# Patient Record
Sex: Female | Born: 1935 | Race: White | Hispanic: No
Health system: Southern US, Community
[De-identification: ages and names within clinical notes are randomized; demographics above are authoritative.]

---

## 2003-06-17 ENCOUNTER — Other Ambulatory Visit: Payer: Self-pay

## 2004-07-27 ENCOUNTER — Emergency Department: Payer: Self-pay | Admitting: General Practice

## 2005-01-12 ENCOUNTER — Ambulatory Visit: Payer: Self-pay | Admitting: Family Medicine

## 2005-05-22 ENCOUNTER — Ambulatory Visit: Payer: Self-pay | Admitting: Oncology

## 2005-06-19 ENCOUNTER — Ambulatory Visit: Payer: Self-pay | Admitting: Surgery

## 2005-06-20 ENCOUNTER — Ambulatory Visit: Payer: Self-pay | Admitting: Oncology

## 2005-07-13 ENCOUNTER — Ambulatory Visit: Payer: Self-pay | Admitting: Oncology

## 2005-08-13 ENCOUNTER — Ambulatory Visit: Payer: Self-pay | Admitting: Oncology

## 2005-09-12 ENCOUNTER — Ambulatory Visit: Payer: Self-pay | Admitting: Oncology

## 2005-09-29 ENCOUNTER — Ambulatory Visit: Payer: Self-pay | Admitting: Oncology

## 2005-10-13 ENCOUNTER — Ambulatory Visit: Payer: Self-pay | Admitting: Oncology

## 2005-11-12 ENCOUNTER — Ambulatory Visit: Payer: Self-pay | Admitting: Oncology

## 2005-12-29 ENCOUNTER — Ambulatory Visit: Payer: Self-pay | Admitting: Oncology

## 2006-01-11 ENCOUNTER — Ambulatory Visit: Payer: Self-pay | Admitting: Oncology

## 2006-01-13 ENCOUNTER — Ambulatory Visit: Payer: Self-pay | Admitting: Oncology

## 2006-02-12 ENCOUNTER — Ambulatory Visit: Payer: Self-pay | Admitting: Oncology

## 2006-03-15 ENCOUNTER — Ambulatory Visit: Payer: Self-pay | Admitting: Oncology

## 2006-04-14 ENCOUNTER — Ambulatory Visit: Payer: Self-pay | Admitting: Oncology

## 2006-05-15 ENCOUNTER — Ambulatory Visit: Payer: Self-pay | Admitting: Oncology

## 2006-06-15 ENCOUNTER — Ambulatory Visit: Payer: Self-pay | Admitting: Oncology

## 2006-07-19 ENCOUNTER — Ambulatory Visit: Payer: Self-pay | Admitting: Oncology

## 2006-07-20 ENCOUNTER — Ambulatory Visit: Payer: Self-pay | Admitting: Oncology

## 2006-07-25 ENCOUNTER — Ambulatory Visit: Payer: Self-pay | Admitting: Oncology

## 2006-08-14 ENCOUNTER — Ambulatory Visit: Payer: Self-pay | Admitting: Oncology

## 2006-10-23 ENCOUNTER — Ambulatory Visit: Payer: Self-pay | Admitting: Family Medicine

## 2006-10-24 ENCOUNTER — Ambulatory Visit: Payer: Self-pay | Admitting: Oncology

## 2006-11-10 ENCOUNTER — Other Ambulatory Visit: Payer: Self-pay

## 2006-11-10 ENCOUNTER — Inpatient Hospital Stay: Payer: Self-pay | Admitting: Unknown Physician Specialty

## 2006-11-13 ENCOUNTER — Ambulatory Visit: Payer: Self-pay | Admitting: Oncology

## 2007-01-14 ENCOUNTER — Ambulatory Visit: Payer: Self-pay | Admitting: Oncology

## 2007-02-07 ENCOUNTER — Ambulatory Visit: Payer: Self-pay | Admitting: Oncology

## 2007-02-13 ENCOUNTER — Ambulatory Visit: Payer: Self-pay | Admitting: Oncology

## 2007-02-27 ENCOUNTER — Ambulatory Visit: Payer: Self-pay | Admitting: Oncology

## 2007-03-07 ENCOUNTER — Ambulatory Visit: Payer: Self-pay | Admitting: Oncology

## 2007-03-11 ENCOUNTER — Ambulatory Visit: Payer: Self-pay | Admitting: General Practice

## 2007-03-16 ENCOUNTER — Ambulatory Visit: Payer: Self-pay | Admitting: Oncology

## 2007-03-25 ENCOUNTER — Inpatient Hospital Stay: Payer: Self-pay | Admitting: General Practice

## 2007-06-16 ENCOUNTER — Ambulatory Visit: Payer: Self-pay | Admitting: Oncology

## 2007-06-20 ENCOUNTER — Ambulatory Visit: Payer: Self-pay | Admitting: Oncology

## 2007-07-14 ENCOUNTER — Ambulatory Visit: Payer: Self-pay | Admitting: Oncology

## 2007-08-14 IMAGING — CT NM PET TUM IMG RESTAG (PS) SKULL BASE T - THIGH
1 series · 1 of 25 positions shown · non-contrast
Comparison: none

REASON FOR EXAM: lymphoma eval for residual disease
COMMENTS:

PROCEDURE:     PET - PET/CT RESTAGING LYMPHOMA  - September 29, 2005 [DATE]
RESULT:
HISTORY: Lymphoma.

[Series 3: ct wb fusion · axial · 4.0mm · 0.98mm/px · 1 of 488 slices shown]
[im 244/488]
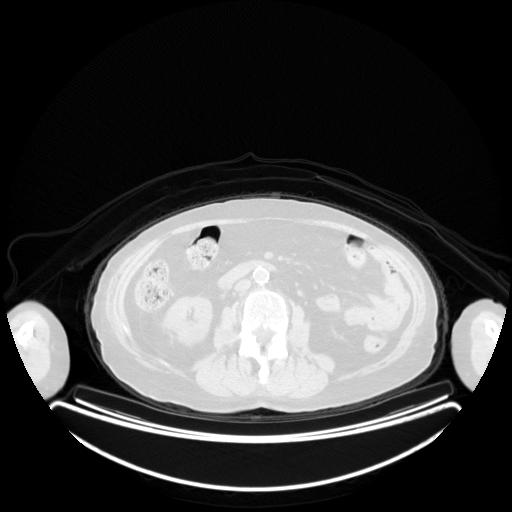

[1 of 25 positions shown; findings below may reference images not displayed]

COMPARISON STUDIES: No recent.

PROCEDURE AND FINDINGS: Following determination of fasting blood sugar of
103 mg/dl whole body PET scan was performed using 12 mCi of F-18 FDG.
Multifocal increased activity is noted throughout the cervical, thoracic and
lumbar spine. This is most likely secondary to the effects of chemotherapy.
No other abnormalities are identified to suggest metastatic disease or
presence of residual disease.
IMPRESSION: 1)Mild increased activity is noted throughout the cervical, thoracic and
lumbar spine marrow. This is most likely from marrow regeneration from prior
chemotherapy.

2)The exam is otherwise negative. No evidence of recurrent lymphoma is
noted.

## 2007-09-13 ENCOUNTER — Ambulatory Visit: Payer: Self-pay | Admitting: Oncology

## 2007-09-20 ENCOUNTER — Ambulatory Visit: Payer: Self-pay | Admitting: Oncology

## 2007-10-14 ENCOUNTER — Ambulatory Visit: Payer: Self-pay | Admitting: Oncology

## 2008-02-05 ENCOUNTER — Ambulatory Visit: Payer: Self-pay | Admitting: Oncology

## 2008-02-13 ENCOUNTER — Ambulatory Visit: Payer: Self-pay | Admitting: Family

## 2008-02-13 ENCOUNTER — Ambulatory Visit: Payer: Self-pay | Admitting: Oncology

## 2008-02-18 ENCOUNTER — Ambulatory Visit: Payer: Self-pay | Admitting: Unknown Physician Specialty

## 2008-02-26 ENCOUNTER — Ambulatory Visit: Payer: Self-pay | Admitting: Unknown Physician Specialty

## 2008-03-15 ENCOUNTER — Ambulatory Visit: Payer: Self-pay | Admitting: Oncology

## 2008-04-14 ENCOUNTER — Ambulatory Visit: Payer: Self-pay | Admitting: Oncology

## 2008-07-13 ENCOUNTER — Ambulatory Visit: Payer: Self-pay | Admitting: Oncology

## 2008-08-12 ENCOUNTER — Ambulatory Visit: Payer: Self-pay | Admitting: Oncology

## 2008-08-13 ENCOUNTER — Ambulatory Visit: Payer: Self-pay | Admitting: Oncology

## 2008-09-01 ENCOUNTER — Ambulatory Visit: Payer: Self-pay | Admitting: Family Medicine

## 2008-09-12 ENCOUNTER — Ambulatory Visit: Payer: Self-pay | Admitting: Oncology

## 2008-09-25 ENCOUNTER — Ambulatory Visit: Payer: Self-pay | Admitting: Oncology

## 2008-10-01 ENCOUNTER — Ambulatory Visit: Payer: Self-pay | Admitting: Family Medicine

## 2008-10-13 ENCOUNTER — Ambulatory Visit: Payer: Self-pay | Admitting: Oncology

## 2008-11-12 ENCOUNTER — Ambulatory Visit: Payer: Self-pay | Admitting: Oncology

## 2008-12-10 ENCOUNTER — Ambulatory Visit: Payer: Self-pay | Admitting: Oncology

## 2008-12-13 ENCOUNTER — Ambulatory Visit: Payer: Self-pay | Admitting: Oncology

## 2008-12-18 ENCOUNTER — Ambulatory Visit: Payer: Self-pay | Admitting: General Practice

## 2009-04-28 ENCOUNTER — Ambulatory Visit: Payer: Self-pay | Admitting: Anesthesiology

## 2009-05-15 ENCOUNTER — Ambulatory Visit: Payer: Self-pay | Admitting: Oncology

## 2009-06-10 ENCOUNTER — Ambulatory Visit: Payer: Self-pay | Admitting: Oncology

## 2009-06-15 ENCOUNTER — Ambulatory Visit: Payer: Self-pay | Admitting: Anesthesiology

## 2009-06-15 ENCOUNTER — Ambulatory Visit: Payer: Self-pay | Admitting: Oncology

## 2009-06-16 ENCOUNTER — Ambulatory Visit: Payer: Self-pay | Admitting: Oncology

## 2009-07-22 ENCOUNTER — Ambulatory Visit: Payer: Self-pay | Admitting: Anesthesiology

## 2009-08-13 ENCOUNTER — Ambulatory Visit: Payer: Self-pay | Admitting: Anesthesiology

## 2009-09-12 ENCOUNTER — Ambulatory Visit: Payer: Self-pay | Admitting: Oncology

## 2009-09-23 ENCOUNTER — Ambulatory Visit: Payer: Self-pay | Admitting: Oncology

## 2009-09-28 ENCOUNTER — Ambulatory Visit: Payer: Self-pay | Admitting: Oncology

## 2009-10-08 ENCOUNTER — Ambulatory Visit: Payer: Self-pay | Admitting: Anesthesiology

## 2009-10-13 ENCOUNTER — Ambulatory Visit: Payer: Self-pay | Admitting: Oncology

## 2009-10-26 ENCOUNTER — Inpatient Hospital Stay: Payer: Self-pay | Admitting: Specialist

## 2009-11-12 ENCOUNTER — Ambulatory Visit: Payer: Self-pay | Admitting: Oncology

## 2009-12-06 ENCOUNTER — Ambulatory Visit: Payer: Self-pay | Admitting: Oncology

## 2009-12-09 ENCOUNTER — Ambulatory Visit: Payer: Self-pay | Admitting: Anesthesiology

## 2010-01-26 ENCOUNTER — Ambulatory Visit: Payer: Self-pay | Admitting: Anesthesiology

## 2010-03-04 ENCOUNTER — Emergency Department: Payer: Self-pay | Admitting: Emergency Medicine

## 2010-03-08 ENCOUNTER — Ambulatory Visit: Payer: Self-pay | Admitting: Oncology

## 2010-03-15 ENCOUNTER — Ambulatory Visit: Payer: Self-pay | Admitting: Oncology

## 2010-04-22 ENCOUNTER — Ambulatory Visit: Payer: Self-pay | Admitting: Oncology

## 2010-04-26 ENCOUNTER — Ambulatory Visit: Payer: Self-pay | Admitting: Anesthesiology

## 2010-05-15 ENCOUNTER — Ambulatory Visit: Payer: Self-pay | Admitting: Oncology

## 2010-05-27 ENCOUNTER — Ambulatory Visit: Payer: Self-pay | Admitting: Anesthesiology

## 2010-08-17 ENCOUNTER — Ambulatory Visit: Payer: Self-pay | Admitting: Unknown Physician Specialty

## 2010-08-30 ENCOUNTER — Ambulatory Visit: Payer: Self-pay | Admitting: Unknown Physician Specialty

## 2010-09-12 ENCOUNTER — Ambulatory Visit: Payer: Self-pay | Admitting: Anesthesiology

## 2010-09-20 ENCOUNTER — Ambulatory Visit: Payer: Self-pay | Admitting: Oncology

## 2010-10-14 ENCOUNTER — Ambulatory Visit: Payer: Self-pay | Admitting: Oncology

## 2011-01-27 ENCOUNTER — Ambulatory Visit: Payer: Self-pay | Admitting: Oncology

## 2011-01-28 ENCOUNTER — Emergency Department: Payer: Self-pay | Admitting: *Deleted

## 2011-01-29 ENCOUNTER — Ambulatory Visit: Payer: Self-pay | Admitting: Oncology

## 2011-02-07 ENCOUNTER — Inpatient Hospital Stay: Payer: Self-pay | Admitting: Internal Medicine

## 2011-02-13 ENCOUNTER — Inpatient Hospital Stay: Payer: Self-pay | Admitting: Internal Medicine

## 2011-02-13 ENCOUNTER — Ambulatory Visit: Payer: Self-pay | Admitting: Oncology

## 2011-07-26 ENCOUNTER — Ambulatory Visit: Payer: Self-pay | Admitting: Oncology

## 2011-07-26 LAB — COMPREHENSIVE METABOLIC PANEL
Albumin: 3.7 g/dL (ref 3.4–5.0)
Anion Gap: 10 (ref 7–16)
BUN: 18 mg/dL (ref 7–18)
Chloride: 103 mmol/L (ref 98–107)
Co2: 27 mmol/L (ref 21–32)
Creatinine: 0.95 mg/dL (ref 0.60–1.30)
Glucose: 99 mg/dL (ref 65–99)
Osmolality: 281 (ref 275–301)
Sodium: 140 mmol/L (ref 136–145)
Total Protein: 7 g/dL (ref 6.4–8.2)

## 2011-07-26 LAB — CBC CANCER CENTER
Basophil %: 0.5 %
Eosinophil #: 0.1 x10 3/mm (ref 0.0–0.7)
HGB: 12.2 g/dL (ref 12.0–16.0)
Lymphocyte %: 20.3 %
MCH: 31.5 pg (ref 26.0–34.0)
MCHC: 34 g/dL (ref 32.0–36.0)
MCV: 93 fL (ref 80–100)
Monocyte #: 0.7 x10 3/mm (ref 0.0–0.7)
Neutrophil %: 67.3 %
RDW: 13.8 % (ref 11.5–14.5)

## 2011-08-14 ENCOUNTER — Ambulatory Visit: Payer: Self-pay | Admitting: Oncology

## 2011-10-12 ENCOUNTER — Emergency Department: Payer: Self-pay | Admitting: Emergency Medicine

## 2011-11-13 ENCOUNTER — Ambulatory Visit: Payer: Self-pay | Admitting: Internal Medicine

## 2011-12-02 LAB — CBC
Platelet: 174 10*3/uL (ref 150–440)
RDW: 16.6 % — ABNORMAL HIGH (ref 11.5–14.5)
WBC: 6.6 10*3/uL (ref 3.6–11.0)

## 2011-12-02 LAB — BASIC METABOLIC PANEL
Anion Gap: 16 (ref 7–16)
BUN: 52 mg/dL — ABNORMAL HIGH (ref 7–18)
Chloride: 95 mmol/L — ABNORMAL LOW (ref 98–107)
Co2: 21 mmol/L (ref 21–32)
Creatinine: 2.12 mg/dL — ABNORMAL HIGH (ref 0.60–1.30)
EGFR (Non-African Amer.): 21 — ABNORMAL LOW
Sodium: 132 mmol/L — ABNORMAL LOW (ref 136–145)

## 2011-12-03 ENCOUNTER — Inpatient Hospital Stay: Payer: Self-pay | Admitting: Internal Medicine

## 2011-12-03 LAB — URINALYSIS, COMPLETE
Leukocyte Esterase: NEGATIVE
Nitrite: NEGATIVE
Protein: 100
RBC,UR: 4 /HPF (ref 0–5)
Specific Gravity: 1.023 (ref 1.003–1.030)
Squamous Epithelial: 3
WBC UR: 1 /HPF (ref 0–5)

## 2011-12-03 LAB — BASIC METABOLIC PANEL
Anion Gap: 22 — ABNORMAL HIGH (ref 7–16)
BUN: 52 mg/dL — ABNORMAL HIGH (ref 7–18)
BUN: 55 mg/dL — ABNORMAL HIGH (ref 7–18)
Calcium, Total: 9.5 mg/dL (ref 8.5–10.1)
Co2: 13 mmol/L — ABNORMAL LOW (ref 21–32)
Co2: 15 mmol/L — ABNORMAL LOW (ref 21–32)
Creatinine: 2.21 mg/dL — ABNORMAL HIGH (ref 0.60–1.30)
EGFR (African American): 21 — ABNORMAL LOW
EGFR (Non-African Amer.): 20 — ABNORMAL LOW
EGFR (Non-African Amer.): 18 — ABNORMAL LOW
Glucose: 125 mg/dL — ABNORMAL HIGH (ref 65–99)
Glucose: 115 mg/dL — ABNORMAL HIGH (ref 65–99)
Osmolality: 284 (ref 275–301)
Osmolality: 284 (ref 275–301)

## 2011-12-03 LAB — MAGNESIUM: Magnesium: 2.3 mg/dL

## 2011-12-03 LAB — CK TOTAL AND CKMB (NOT AT ARMC)
CK, Total: 133 U/L (ref 21–215)
CK, Total: 120 U/L (ref 21–215)
CK, Total: 112 U/L (ref 21–215)

## 2011-12-03 LAB — HEPATIC FUNCTION PANEL A (ARMC)
Albumin: 3.4 g/dL (ref 3.4–5.0)
Albumin: 3.1 g/dL — ABNORMAL LOW (ref 3.4–5.0)
Alkaline Phosphatase: 197 U/L — ABNORMAL HIGH (ref 50–136)
Alkaline Phosphatase: 195 U/L — ABNORMAL HIGH (ref 50–136)
Bilirubin, Direct: 1.9 mg/dL — ABNORMAL HIGH (ref 0.00–0.20)
Bilirubin,Total: 2.6 mg/dL — ABNORMAL HIGH (ref 0.2–1.0)
Bilirubin,Total: 2.9 mg/dL — ABNORMAL HIGH (ref 0.2–1.0)
SGOT(AST): 225 U/L — ABNORMAL HIGH (ref 15–37)
SGOT(AST): 277 U/L — ABNORMAL HIGH (ref 15–37)
SGPT (ALT): 260 U/L — ABNORMAL HIGH

## 2011-12-03 LAB — TROPONIN I: Troponin-I: 0.05 ng/mL

## 2011-12-03 LAB — URIC ACID: Uric Acid: 13.5 mg/dL — ABNORMAL HIGH (ref 2.6–6.0)

## 2011-12-03 LAB — SODIUM, URINE, RANDOM: Sodium, Urine Random: 10 mmol/L (ref 20–110)

## 2011-12-03 LAB — PRO B NATRIURETIC PEPTIDE: B-Type Natriuretic Peptide: 31957 pg/mL — ABNORMAL HIGH (ref 0–450)

## 2011-12-04 LAB — BASIC METABOLIC PANEL
Calcium, Total: 9 mg/dL (ref 8.5–10.1)
Chloride: 98 mmol/L (ref 98–107)
Co2: 14 mmol/L — ABNORMAL LOW (ref 21–32)
Creatinine: 2.66 mg/dL — ABNORMAL HIGH (ref 0.60–1.30)
Glucose: 54 mg/dL — ABNORMAL LOW (ref 65–99)
Osmolality: 283 (ref 275–301)
Potassium: 4.5 mmol/L (ref 3.5–5.1)
Sodium: 134 mmol/L — ABNORMAL LOW (ref 136–145)

## 2011-12-04 LAB — HEPATIC FUNCTION PANEL A (ARMC)
Albumin: 3.3 g/dL — ABNORMAL LOW (ref 3.4–5.0)
Alkaline Phosphatase: 192 U/L — ABNORMAL HIGH (ref 50–136)
Bilirubin, Direct: 2.2 mg/dL — ABNORMAL HIGH (ref 0.00–0.20)
Bilirubin,Total: 3.4 mg/dL — ABNORMAL HIGH (ref 0.2–1.0)
Total Protein: 6 g/dL — ABNORMAL LOW (ref 6.4–8.2)

## 2011-12-05 LAB — CBC WITH DIFFERENTIAL/PLATELET
Basophil #: 0 10*3/uL (ref 0.0–0.1)
Eosinophil #: 0 10*3/uL (ref 0.0–0.7)
Eosinophil %: 0 %
HCT: 42.8 % (ref 35.0–47.0)
HGB: 13.2 g/dL (ref 12.0–16.0)
Lymphocyte #: 0.5 10*3/uL — ABNORMAL LOW (ref 1.0–3.6)
Lymphocyte %: 3.7 %
MCH: 26.5 pg (ref 26.0–34.0)
MCHC: 30.8 g/dL — ABNORMAL LOW (ref 32.0–36.0)
MCV: 86 fL (ref 80–100)
Monocyte #: 0.9 x10 3/mm (ref 0.2–0.9)
Neutrophil #: 11.7 10*3/uL — ABNORMAL HIGH (ref 1.4–6.5)
Platelet: 113 10*3/uL — ABNORMAL LOW (ref 150–440)
RBC: 4.99 10*6/uL (ref 3.80–5.20)
RDW: 16.9 % — ABNORMAL HIGH (ref 11.5–14.5)
WBC: 13.1 10*3/uL — ABNORMAL HIGH (ref 3.6–11.0)

## 2011-12-05 LAB — HEPATIC FUNCTION PANEL A (ARMC)
Albumin: 3.5 g/dL (ref 3.4–5.0)
Bilirubin, Direct: 2.5 mg/dL — ABNORMAL HIGH (ref 0.00–0.20)
Bilirubin,Total: 3.5 mg/dL — ABNORMAL HIGH (ref 0.2–1.0)
SGOT(AST): 682 U/L — ABNORMAL HIGH (ref 15–37)
Total Protein: 6.2 g/dL — ABNORMAL LOW (ref 6.4–8.2)

## 2011-12-05 LAB — BASIC METABOLIC PANEL
BUN: 70 mg/dL — ABNORMAL HIGH (ref 7–18)
Calcium, Total: 8.8 mg/dL (ref 8.5–10.1)
Co2: 19 mmol/L — ABNORMAL LOW (ref 21–32)
EGFR (African American): 16 — ABNORMAL LOW
EGFR (Non-African Amer.): 14 — ABNORMAL LOW
Glucose: 118 mg/dL — ABNORMAL HIGH (ref 65–99)
Sodium: 138 mmol/L (ref 136–145)

## 2011-12-05 LAB — URINE CULTURE

## 2011-12-09 LAB — CULTURE, BLOOD (SINGLE)

## 2011-12-14 ENCOUNTER — Ambulatory Visit: Payer: Self-pay | Admitting: Internal Medicine

## 2011-12-14 DEATH — deceased

## 2012-02-01 ENCOUNTER — Ambulatory Visit: Payer: Self-pay | Admitting: Oncology

## 2012-12-12 IMAGING — CR DG CHEST 2V
1 series · 3 of 3 positions shown · non-contrast
Comparison: none

REASON FOR EXAM: right sided pain
COMMENTS:

PROCEDURE:     DXR - DXR CHEST PA (OR AP) AND LATERAL  - January 28, 2011  [DATE]
RESULT:     Lungs are clear. The patient has had bilateral shoulder
replacements. Calcified pulmonary nodule noted in the left lung base
consistent with granuloma. No evidence of displaced rib fracture.

[Series 1: view not recorded · 0.17mm/px · 3 of 3 slices shown]
[im 1/3]
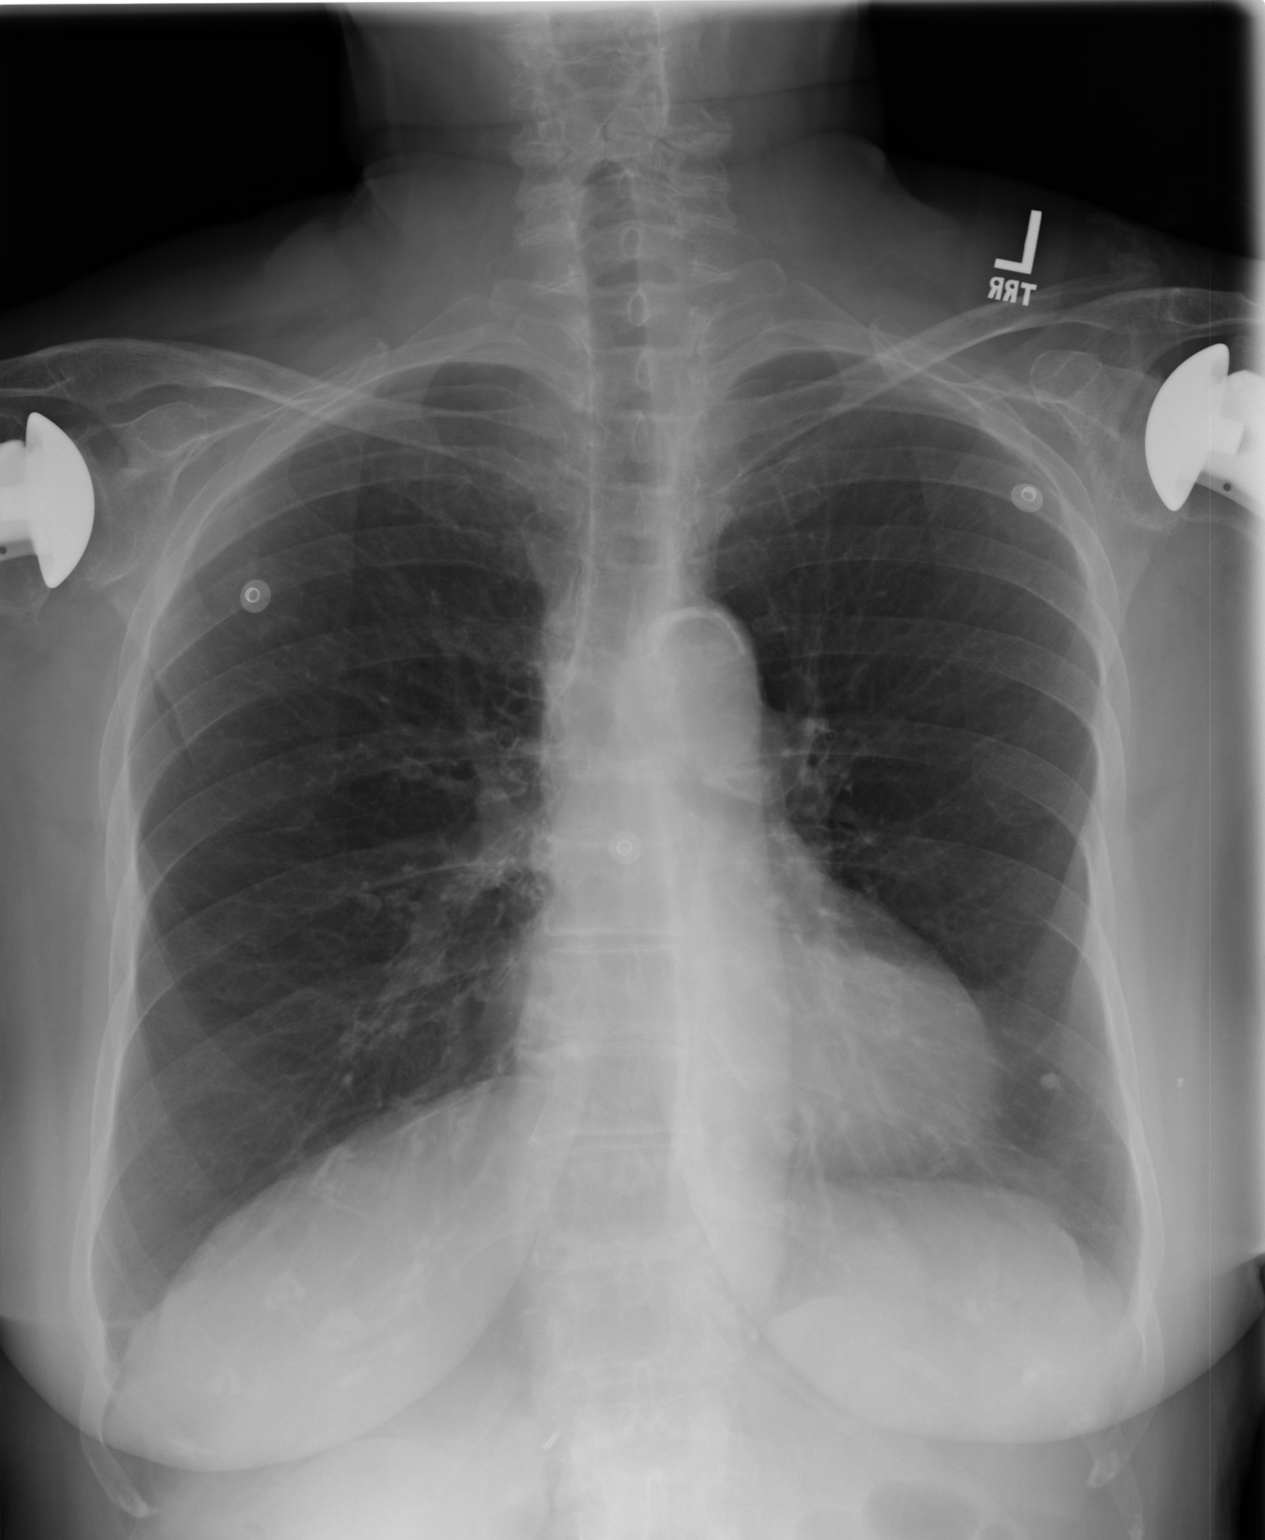
[im 2/3]
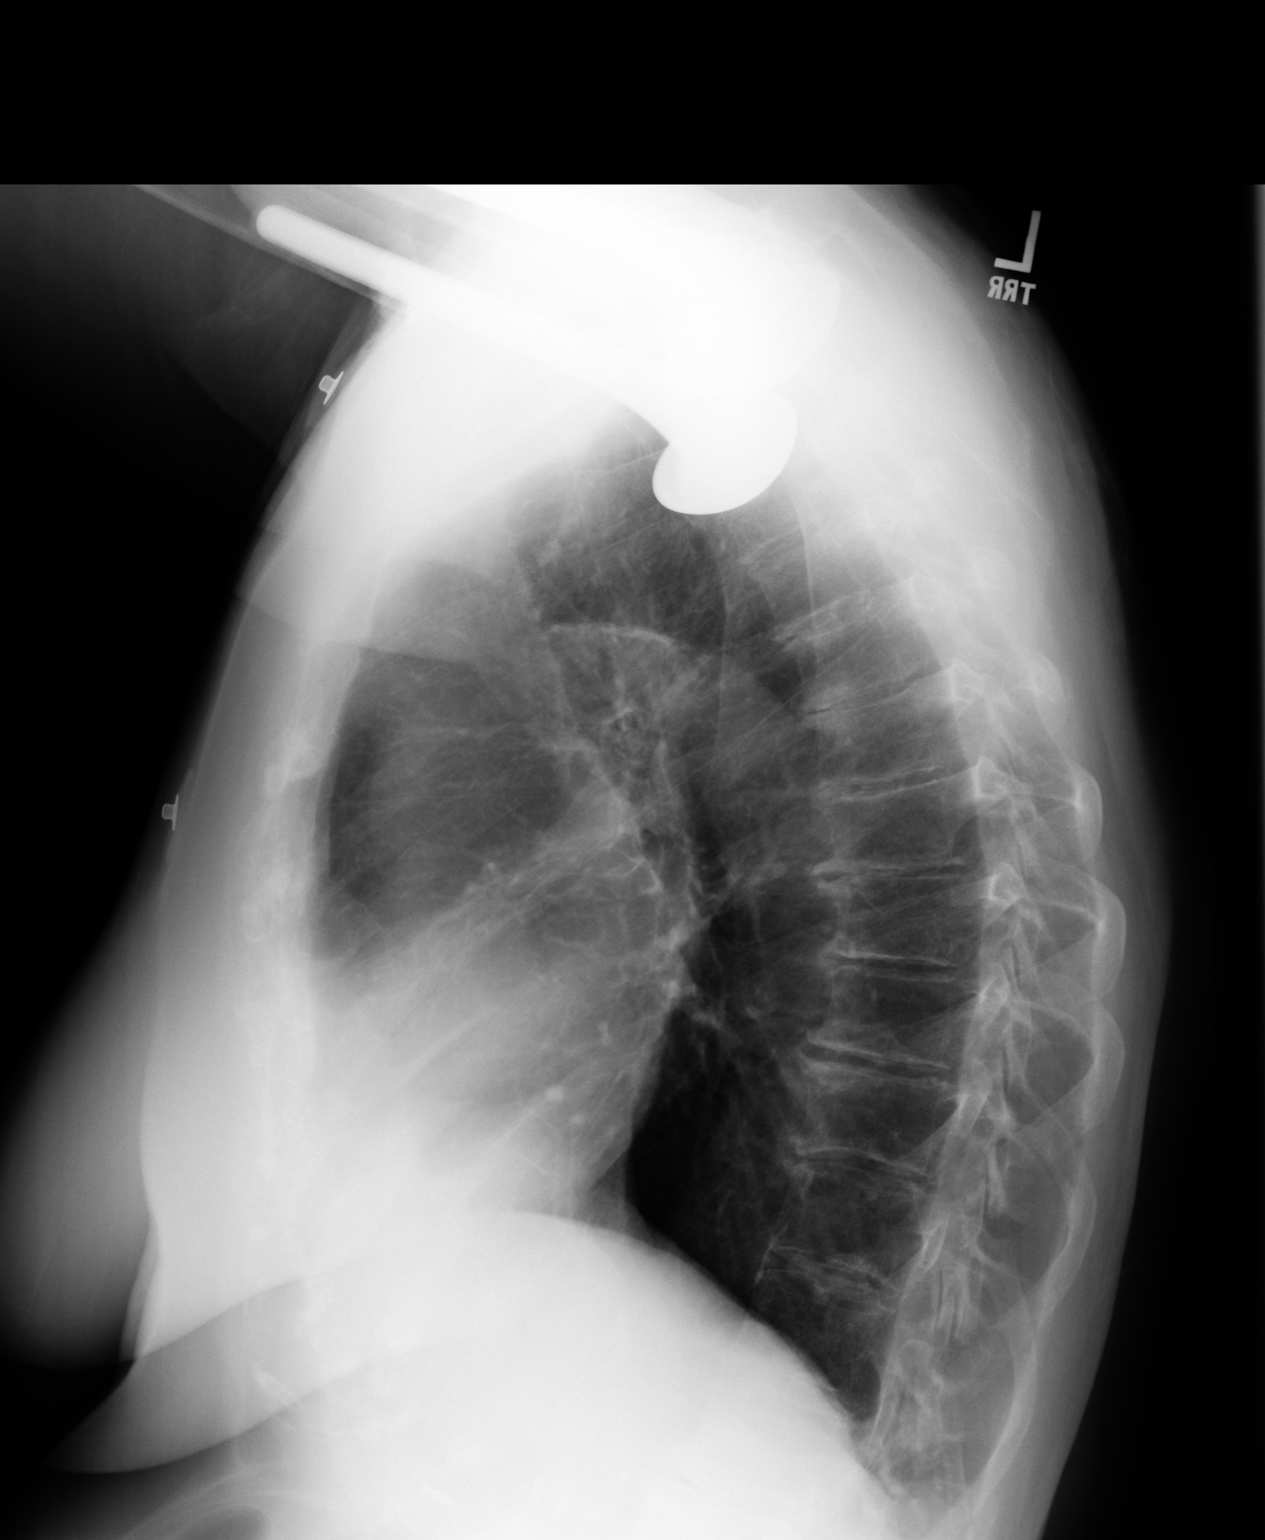
[im 3/3]
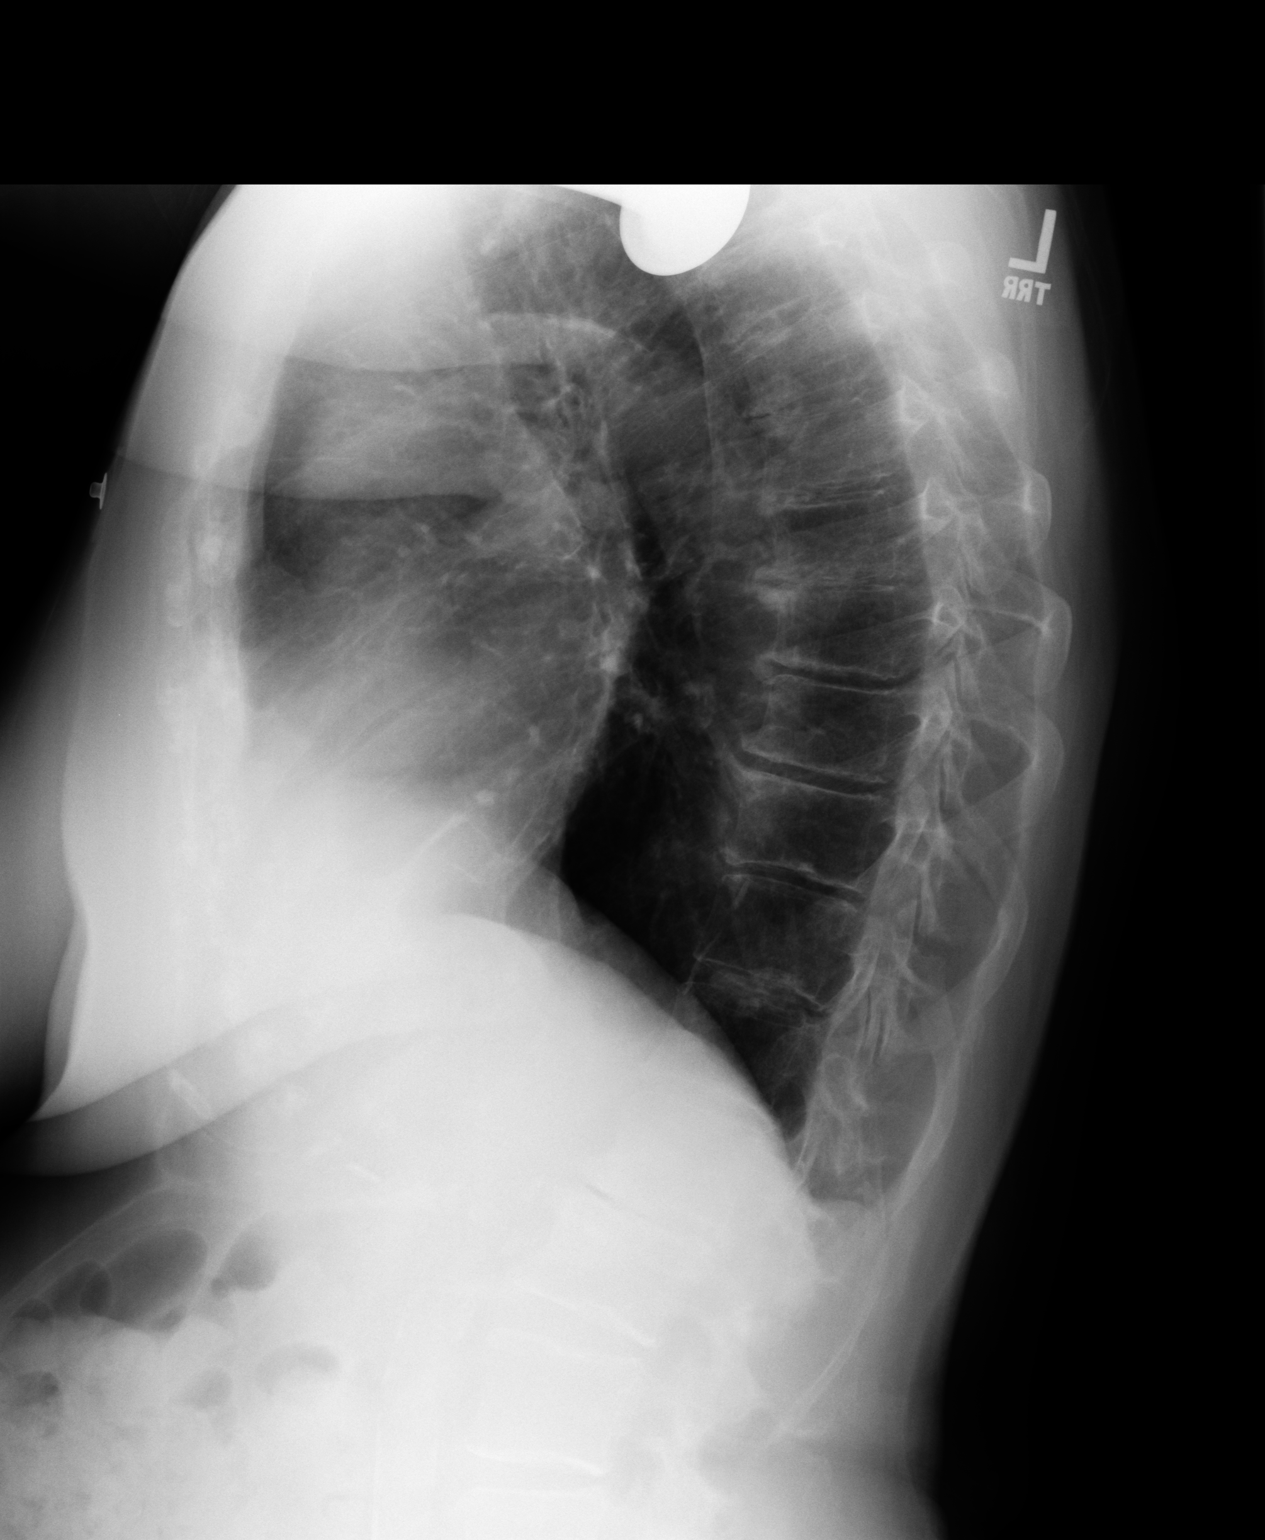

[3 of 3 positions shown; findings below may reference images not displayed]

IMPRESSION: No acute abnormality. The patient has had bilateral
shoulder replacements.

## 2014-09-01 NOTE — Consult Note (Signed)
PATIENT NAME:  Karen Stafford, BARRETTO MR#:  [id number] DATE OF BIRTH:  Aug 08, 1924  DATE OF CONSULTATION:  12/03/2011  REFERRING PHYSICIAN:  Dr. Katharina Caper  CONSULTING PHYSICIAN:  Christena Deem, MD  REASON FOR CONSULTATION: Elevated LFTs.   HISTORY OF PRESENT ILLNESS: Ms. Karen Stafford is an 81 year old Caucasian female who was brought to the hospital earlier today from her nursing home at [facility]. She has been noted to be having increased problems with confusion. She was brought to the Emergency Room for further evaluation. There are no relatives in the room and no assistance. Patient herself is confused and minimally able to contribute to history. Much of the history is obtained from the admission history and physical. Patient has a history of increasing dementia, however, over the period of the past several days she has had increased confusion worsening. It has also been noted that she has been having some increased shortness of breath as well as some lower extremity edema. She apparently also has been on some ciprofloxacin for possible urinary tract infection. In the Emergency Room department when interviewed by the admitting physician she was confused, minimally verbal. She was found to be in acute renal failure with abnormal liver associated enzymes. On evaluation by this physician patient is in no acute distress.   PAST MEDICAL HISTORY:  1. Hypertension. 2. Non-Hodgkin's lymphoma in remission since 2007.  3. Osteoarthritis.  4. Asthma. 5. Previous acute renal failure. 6. History of shingles. 7. Altered mental status since episode possibly of hyponatremia in 2012 raising possible question of central pontine myelolysis versus encephalopathy of hyponatremia.  8. Dementia.   PAST SURGICAL HISTORY:  1. Intraocular lenses 1996, 1997. 2. Cholecystectomy 1999. 3. Right knee arthroscopy 2011. 4. Right shoulder replacement 2003.  5. Spur removal rotator cuff surgery 2004. 6. Left shoulder  replacement 2005. 7. Open reduction internal fixation, right distal fracture 2009. 8. Right knee arthroplasty 2008.   CURRENT MEDICATIONS:  1. Allegra 1 tablet daily. 2. Alprazolam 0.25 mg 1 t.i.d.  3. Aspirin 81 mg daily. 4. Celexa 30 mg a day. 5. Cipro 250 mg 1 orally every 12 hours. 6. Citalopram 10 mg once a day. 7. Colace 100 mg 1 twice a day. 8. Hydrochlorothiazide 25 mg a day. 9. Lasix 40 mg daily. 10. MiraLax.  11. Senna. 12. Singulair 10 mg a day.  13. Systane balance ophthalmic solution p.r.n.  14. Tylenol caplets p.r.n.   ALLERGIES: She is allergic to Augmentin ES 600, sulfa, tetracycline, Vicodin, adhesives and lactose.   REVIEW OF SYSTEMS: Not possible to obtain.   PHYSICAL EXAMINATION:  VITAL SIGNS: Temperature 97.6, pulse 106, respirations 18, blood pressure 134/76, pulse oximetry 96%.   GENERAL: She is an 81 year old Caucasian female no acute distress, confused, not oriented to person, time or place.   HEENT: Normocephalic, atraumatic. Eyes: Anicteric. Nose: Septum midline. Oropharynx: No lesions.   NECK: No JVD. No lymphadenopathy or thyromegaly.   HEART: Regular rate and rhythm.   LUNGS: Clear to auscultation.   ABDOMEN: Slightly protuberant, soft, nontender. Bowel sounds positive, normoactive. There is possibly some amount of ascites with a mild fluid wave. There is no rebound. There is no apparent organomegaly.   EXTREMITIES: No clubbing or cyanosis. There is a 1+ to 2+ lower extremity edema.   LABORATORY, DIAGNOSTIC, AND RADIOLOGICAL DATA: On admission to the hospital she had a glucose 142, BNP elevated at 31,957, BUN 52, creatinine 2.12, sodium 132, potassium 3.5, chloride 95. Her hepatic profile showed a total protein of  6.4, albumin 3.4, total bilirubin 2.6, direct bilirubin 1.0, alkaline phosphatase 197, AST 225, ALT 260. She has had three sets of cardiac enzymes which have been normal with the exception of CPK-MB on the last set being elevated at 4.0  on a scale of 3.6. Hemogram on admission showed white count 6.6, hemoglobin/hematocrit 13.5/42.2, platelet count 174. Urine sodium 10. Urine random osmolality 542. Repeat labs today showed BUN 55, creatinine 2.35, sodium 134, potassium 4.8, chloride 98, bicarbonate 15, uric acid 13.5, magnesium 2.3. LFTs with some amount of change with total bilirubin being 2.9, direct being 1.9, alkaline phosphatase 195, AST 277, ALT 290. She has had a portable chest film suggestive of pleural effusions, basilar atelectasis versus infiltrate. She had an abdominal ultrasound today one for evaluation of kidneys, these showing right renal cyst. No obstruction or stones, left ureteral jet was not visualized, but no obstructive changes were noted. The abdominal ultrasound otherwise showed a biphasic portal venous flow with ascites present. Study was limited due to overlying bowel gas.   ASSESSMENT: Abnormal liver associated enzymes in the setting of tricuspid insufficiency/regurgitation with finding of biphasic portal venous circulation. Ascites was present. Differential diagnosis would include abnormal liver associated enzymes related to Cipro use as she did start a course of Cipro several days ago. There is some question as to whether she may have some mild asterixis. Impression: Cardiac ascites with tricuspid regurgitation.   RECOMMENDATION: Continue to treat congestive heart failure. Daily liver function tests. Will check ammonia this evening. Will follow with you. Thank you for this consultation.  ____________________________ Christena DeemMartin U. Skulskie, MD mus:cms D: 12/03/2011 20:42:29 ET T: 12/04/2011 07:59:46 ET  JOB#: [id number] cc: Christena DeemMartin U. Skulskie, MD, <Dictator>  Christena DeemMARTIN U SKULSKIE MD ELECTRONICALLY SIGNED 01/03/2012 21:09

## 2014-09-01 NOTE — Consult Note (Signed)
Chief Complaint:   Subjective/Chief Complaint poor orientation, lessening responses to examination. denies abdominal pain or nausea.   VITAL SIGNS/ANCILLARY NOTES: **Vital Signs.:   22-Jul-13 15:07   Vital Signs Type Routine   Temperature Temperature (F) 97.4   Celsius 36.3   Temperature Source Oral   Pulse Pulse 98   Respirations Respirations 18   Systolic BP Systolic BP 811   Diastolic BP (mmHg) Diastolic BP (mmHg) 75   Mean BP 83   Pulse Ox % Pulse Ox % 97   Pulse Ox Activity Level  At rest   Oxygen Delivery Room Air/ 21 %   Brief Assessment:   Cardiac Irregular    Respiratory clear BS    Gastrointestinal details normal Soft  Nontender  Nondistended  No masses palpable  Bowel sounds normal   Lab Results: Hepatic:  22-Jul-13 03:35    Bilirubin, Total  3.4   Bilirubin, Direct  2.2 (Result(s) reported on 04 Dec 2011 at 04:21AM.)   Alkaline Phosphatase  192   SGPT (ALT)  413 (12-78 NOTE: NEW REFERENCE RANGE 04/07/2011)   SGOT (AST)  470   Total Protein, Serum  6.0   Albumin, Serum  3.3  Routine Chem:  22-Jul-13 03:35    Ammonia, Plasma  53 (Result(s) reported on 04 Dec 2011 at 04:33AM.)   Glucose, Serum  54   BUN  60   Creatinine (comp)  2.66   Sodium, Serum  134   Potassium, Serum 4.5   Chloride, Serum 98   CO2, Serum  14   Calcium (Total), Serum 9.0   Anion Gap  22   Osmolality (calc) 283   eGFR (African American)  18   eGFR (Non-African American)  16 (eGFR values <64m/min/1.73 m2 may be an indication of chronic kidney disease (CKD). Calculated eGFR is useful in patients with stable renal function. The eGFR calculation will not be reliable in acutely ill patients when serum creatinine is changing rapidly. It is not useful in  patients on dialysis. The eGFR calculation may not be applicable to patients at the low and high extremes of body sizes, pregnant women, and vegetarians.)   Assessment/Plan:  Assessment/Plan:   Assessment 1) abnormal liver  tests-biphasic portal flow noted.  TR/hepatic congestion in the setting of chf. CT today not indicative of other etiology.  some increasing abnormality today, non-specific pattern.  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 2) congestive hepatopathy will improve with improvement/optimization of cardiac status/renal status.   Electronic Signatures: SLoistine Simas(MD)  (Signed 22-Jul-13 21:13)  Authored: Chief Complaint, VITAL SIGNS/ANCILLARY NOTES, Brief Assessment, Lab Results, Assessment/Plan   Last Updated: 22-Jul-13 21:13 by SLoistine Simas(MD)

## 2014-09-01 NOTE — H&P (Signed)
PATIENT NAME:  Karen Stafford, Karen Stafford MR#:  [id number] DATE OF BIRTH:  03/17/1925  DATE OF ADMISSION:  12/03/2011  PRIMARY CARE PHYSICIAN: Rhona Leavens. Burnett Sheng, MD   CHIEF COMPLAINT: Altered mental status, dyspnea.   HISTORY OF PRESENT ILLNESS: The patient is an 81 year old Caucasian female with chronic medical conditions notable for dementia, a nursing home resident at [facility], presents with progressive confusion per Nursing Home Facility.  History is obtained from Emergency Department  records as well as a friend, Ilda Foil, who is at bedside. History is very limited. Per Ms. Katrinka Blazing, the patient has had functional and mental decline over the last few months, increasingly confused.  However, it seems that this confusion worsened over the preceding few days.  Emergency Department  records note that the patient has been having some shortness of breath, and Ms. Katrinka Blazing admits that the patient has had increased lower extremity edema for some time.  It is unclear the duration of this increased lower extremity edema.  Review of nursing home records notes that the patient was started on Lasix 40 mg daily as needed for edema on 11/02/2011, but I do not see it documented that this medication was administered.  Of note, a review of nursing home medication reconciliation sheets also indicate that the patient was started on Ciprofloxacin 50 mg b.i.d.  for a 7-day course starting two days prior to presentation on 12/01/2011.  It is unclear if she was having symptoms or if they had performed an outpatient study indicating this patient had a urinary tract infection warranting this treatment. In the Emergency Department , the patient is confused, minimally verbal, and has been found to be in acute renal failure as well as concern for possible congestive heart failure, so we were called for admission. Her friend relates that due to her cognitive decline, she has been removed from the assisted living area of [facility]  to the nursing home facility.  The patient was last hospitalized in October of 2012 for altered mental status presumed to be secondary to electrolyte imbalances as she was significantly hyponatremic at that time.   PAST MEDICAL HISTORY: Notable for: 1. Hypertension.  2. Non-Hodgkin lymphoma, in remission since 2007. 3. Osteoarthritis.  4. Asthma.  5. Prior history of acute renal failure.  6. Shingles.  7. Altered mental status presumed secondary to hyponatremia in 2012.  8. Dementia.  PAST SURGICAL HISTORY: Notable for: 1. Implants in eyes in 1996 and 1997. 2. Cholecystectomy in 1999.  3. Right knee arthroscopy in 2011.  4. Right shoulder replacement in 2003. 5. Spur removal from rotator cuff in 2004.  6. Left shoulder replacement in 2005.  7. Open reduction internal fixation of right distal fracture in 2009. 8. Right knee arthroplasty in 2008.  MEDICATIONS:  1. Aspirin 81 mg chewable, 1 tablet daily. 2. Singulair 10 mg, 1 tablet by mouth daily.  3. Hydrochlorothiazide 25 mg daily. 4. MiraLAX large size, 1 bottle cap or packet daily. 5. Senokot 1 tablet twice a day.  6. Colace 100 mg twice a day. 7. Tylenol 500 mg, 1 to 2 tablets by mouth every 6 hours as needed for back pain.  8. Systane eye drops, 1 or 2 drops into right eye as needed for eye irritation. 9. Celexa 10 mg daily. 10. Lasix 40 mg daily p.r.n.  edema.  11. Allegra 180 mg daily, last dose was on the 20th of July.  12. Ciprofloxacin 50 mg twice a day for 7 days. Last  dose was on the 20th, and this order started July 19th at 8 p.m.   ALLERGIES: Morphine, lactose, sulfa, Vicodin, Augmentin, tetracycline and adhesive.  SOCIAL HISTORY: Per records, no tobacco, alcohol or illicit drug use. [facility] nursing home area.   FAMILY HISTORY: Per records, family history is notable for hypertension.   REVIEW OF SYSTEMS: Unable to obtain due to the patient's decline in mental status; however, per report,  notable for dyspnea and increased lower extremity edema.   PHYSICAL EXAMINATION:  VITAL SIGNS: Temperature 97.6, blood pressure 127/76, heart rate 110, respirations 20, saturating 93% on room air.  Weight is 77.1 kg.   GENERAL: The patient is awake, somnolent, but arouses to loud voice. She is oriented to person, place and time, no apparent distress.   HEENT: Head is atraumatic.  Pupils are equal, round and reactive to light.  Anicteric sclerae. Dry mucous membranes. Oropharynx is clear.   NECK: Supple, no jugular venous distention or carotid bruits appreciated.   CARDIOVASCULAR: Tachycardic, no murmur appreciated.   LUNGS: Mild crackles left lung base, no wheezing.   BACK: No costovertebral angle tenderness.  ABDOMEN: Normal bowel sounds, mild suprapubic tenderness and palpation.  No organomegaly, nondistended.   EXTREMITIES: There is 2+ pitting edema bilateral lower extremities to the knees.  There is evidence of healed knee scars bilateral knees. There is decreased range of motion in bilateral hips, unable to assess motor strength in bilateral lower extremities as the patient did not follow commands.  Passive hip flexion in bilateral hips caused the patient to grimace. Hand grip 5 out of 5 bilaterally in upper extremities and 5 out of 5 strength bilateral upper extremities.   SKIN: No rashes identified.   LABORATORY, DIAGNOSTIC AND RADIOLOGICAL DATA: Basic metabolic panel shows sodium 132, which is low, down from 140 in May, potassium 3.5, chloride 95, bicarbonate 21, BUN 52, creatinine 2.12-increased from creatinine of 0.95 as of March of 2013, glucose 142, anion gap of 16, calcium 9.6 with a GFR of 21.0. CBC shows hemoglobin 13.5 with a hematocrit of 42.2, white blood cell count 6.6, platelets 174, with MCV 85. Osmolality is 281.  ABG shows pH 7.48, CO2 26, PO2 84, FiO2 21% with an HcO3 of 19.4. Troponin 0.04. CK 133, CK-MBG 3.5. B-type natriuretic peptide 31,957. EKG shows sinus  tachycardia at 104 beats per minute with first-degree AV block and Q wave V1 through V5 with poor R wave progression and premature ventricular complexes. Chest x-ray shows bilateral small pleural effusions with basilar atelectasis versus infiltrate bilaterally.  No edema. No pneumothorax. Poorly visualized cardiac silhouette due to pleural effusion.   ASSESSMENT/PLAN: An 81 year old female with chronic medical conditions notable for lymphoma, osteoarthritis, recent cognitive decline likely secondary to dementia, presents with worsened altered mental status, shortness of breath, increased lower extremity edema, found to have pleural effusions on x-ray, elevated B-type natriuretic peptide concerning for acute decompensated congestive heart failure versus volume overload as a result of acute renal failure as well as electrolyte imbalances, hyponatremia, respiratory alkalosis, anion gap metabolic gap acidosis.   1. Altered mental status: The etiology of this is unclear at this time. It includes metabolic encephalopathy as a result of acute renal failure, electrolyte imbalances, possible urinary tract infection given the fact that she was on outpatient Ciprofloxacin, acute decompensated congestive heart failure, and the above-listed metabolic derangements. At this time, we will check urinalysis and urine culture to assess for a urinary tract infection, further plan as  delineated below.  2. Acute decompensated congestive heart failure: Unspecified whether this is diastolic or systolic at this time. We will check an echocardiogram, trend cardiac enzymes to rule out ischemic etiology. Given her acute renal failure, we will hold off on the Cardiology consult; however, this patient might need cardiac catheterization eventually to assess for possible ischemic etiology.  We will hold on ACE inhibitor at this time given acute renal failure, and given that she is acutely decompensated we will hold off on initiating beta  blocker therapy as well.  We will continue daily aspirin. Although the patient is in acute renal failure, due to the general volume overload, we will gently diurese her and monitor her renal function. We will place the patient on telemetry and check magnesium levels given premature ventricular complexes noted on her EKG.  3. Acute renal failure: The etiology of this is unclear.  Differential includes hypoperfusion due to acute decompensated congestive heart failure versus obstructive versus as result of a urinary tract infection. We will check a renal ultrasound,  urine electrolytes   to assess fractional excretion of sodium and urea since she has been on diuretic therapy  to assess if there is any prerenal azotemia involved. We will hold her hydrochlorothiazide at this time. Given the congestive heart failure, the patient will receive low-dose Lasix x1, and we will reassess her renal function in the a.m.  We will consult Nephrology, given this acute renal failure.  The patient's creatinine was 0.95 as of March 2013. We will bladder scan the patient. 4. Hypervolemic hyponatremia as a result of congestive heart failure overload as a result of acute renal failure: This time again we will check a urine osmolality to closely delineate this hyponatremia. We will mobilize fluid with very gentle diuresis. We will also request Nephrology assistance with the management of this issue.  5. Anion gap metabolic acidosis: At this time, we will check urinalysis again to rule out any ketonuria, and we will check a lactate as well to further delineate this process.  If the patient does have elevated lactate, she will most likely need IV fluids, but given her volume overload this would be a delicate balance.  6. Respiratory alkalosis: Etiology is unclear. We will check a liver function panel to ensure that there is no hepatic failure, and we will check salicylate level.  7. Prophylaxis: Heparin. 8. Fluids, electrolytes and  nutrition: Check basic metabolic panel in the a.m.  She is on a failure diet.  9. Disposition: She is being admitted to the General Medicine Service for a telemetry bed and ongoing care.    CODE STATUS:  The patient is a DO NOT RESUSCITATE.      Thank you for involving [place] in the care of this patient.  ____________________________ Aurther Loft, DO aeo:cbb D: 12/03/2011 02:30:58 ET T: 12/03/2011 11:44:37 ET JOB#: [id number]  cc: Aurther Loft, DO, <Dictator> Rhona Leavens. Burnett Sheng, MD Deloris Moger E Shadeed Colberg DO ELECTRONICALLY SIGNED 12/05/2011 2:19

## 2014-09-01 NOTE — Discharge Summary (Signed)
PATIENT NAME:  Karen Stafford, Karen Stafford MR#:  [id number] DATE OF BIRTH:  1924-10-14  DATE OF ADMISSION:  12/03/2011 DATE OF DISCHARGE:  12/05/2011  ADMITTING PHYSICIAN: Dr. Sanda KleinAdaorah Stafford DISCHARGING PHYSICIAN: Dr. Enid Baasadhika Kaveri Stafford  PRIMARY CARE PHYSICIAN: Dr. Burnett ShengHedrick   CONSULTATIONS IN THE HOSPITAL:  1. Nephrology consultation by Dr. Thedore Stafford.  2. GI consultation by Dr. Marva Stafford.  3. Palliative care consultation by Dr. Harriett SineNancy Stafford.   DISCHARGE DIAGNOSES:  1. Acute renal failure.  2. Metabolic encephalopathy.  3. Uremia. 4. Elevated ammonia level.  5. Acute hepatic failure secondary to congestive hepatomegaly.  6. Chronic anasarca.  7. Congestive heart failure with ejection fraction of 25%.  8. Pneumonia. 9. Dementia.  10. History of non-Hodgkin's lymphoma in remission.   DISCHARGE MEDICATIONS:  1. Roxanol 20 mg/ mL, 0.25 - 0.5 mL p.o. sublingual every 1 to 2 hours p.r.n.  2. Lorazepam 0.5 to 1 mg p.o. sublingual every 2 to 4 hours p.r.n.  3. Ranitidine 150 mg p.o. b.i.d.   4. ABHR suppository one per rectum every 4 to 6 hours p.r.n.  5. Lasix 40 mg p.o. daily.   LABORATORY, DIAGNOSTIC AND RADIOLOGICAL DATA: WBC 13.1, hemoglobin 13.8, hematocrit 42.8, platelet count 113.   Sodium 138, potassium 3.9, chloride 101, bicarbonate 19, BUN 70, creatinine 2.9, glucose 118, calcium 8.8.   Alkaline phosphatase 224, ALT 651, AST 682, albumin 3.5, direct bilirubin 2.5 with total bilirubin 3.5.    CT of the head without contrast showing no acute intracranial abnormality and findings consistent with chronic small vessel ischemia.   CT of the abdomen and pelvis showing no bowel obstruction or ileus. No evidence of acute urinary tract abnormality. There is renal atrophy on the left side. No evidence of hepatic mass or intrahepatic ductal dilatation. Gallbladder is surgically absent and there are large bilateral pleural effusions and atelectasis versus pneumonia in the left lower lobe and there is  also anasarca present. Blood cultures are negative. Echo Doppler showing dilated left atrium, right-sided pressures are elevated at 30 to 40 mmHg and ejection fraction is less than 25%. Urine cultures negative.   BRIEF HOSPITAL COURSE: Ms. Karen Stafford is an 81 year old female with past medical history significant for dementia, hypertension, arthritis presents from [facility] [place] secondary to altered mental status and also dyspnea. Her cognitive function has been declining rapidly over the past month according to daughter on admission. She has also been having worsening lower extremity edema for which she was treated with Lasix. She was also treated with Cipro for a urinary tract infection. She was found to be in acute renal failure on admission.  1. Acute renal failure probably due to overdiuresis versus secondary to decreased cardiac perfusion causing ATN. Since she clinically appeared dry Lasix was held and she was started on gentle hydration. In spite of which creatinine trended up to 2.9. Her urine output has decreased to less than 150 mL/24 hours. Since she was critically ill, clinically declining, she reached to the point that she might need dialysis. Palliative care was involved and after discussing with patient's daughter and son they have agreed not to start her on dialysis and agreed for comfort care.  2. Acute hepatic failure secondary to congestive hepatomegaly. Ultrasound and also CT abdomen did not show any intrahepatic lesions. She has congestive heart failure with pleural effusions and anasarca, probably has congestive hepatomegaly since Lasix was stopped and she was getting fluids for her renal function. Her liver enzymes have been persistently elevated and gradually getting worse. Ammonia  was also elevated for which she was started initially on lactose with minimal improvement.  3. Congestive heart failure, ejection fraction less than 25%. Again as mentioned above Lasix was held secondary to  above-mentioned reasons and she is not on any ACE inhibitors due to her acute renal failure. Overall clinical condition has been poor with poor prognosis since admission and cognitive decline. She was also found to be in uremic/hepatic encephalopathy. Palliative care was consulted who has discussed with patient's daughter and son who have agreed with more recent decline even prior to coming to the hospital and now this clinical deterioration they did not want any further aggressive measures and requested for hospice home. Hospice screen was ordered and patient is being transferred to hospice home at this time. Prognosis is poor.   CODE STATUS: DO NOT RESUSCITATE.   DISCHARGE DISPOSITION: Hospice home.   TIME SPENT ON DISCHARGE: 40 minutes.  ____________________________ Karen Stafford Karen Stafford D: 12/05/2011 14:43:35 ET T: 12/06/2011 10:43:23 ET JOB#: [id number]  cc: Karen Stafford, <Dictator> Karen Stafford. Karen Sheng, Stafford Karen Stafford ELECTRONICALLY SIGNED 12/11/2011 15:12

## 2014-09-01 NOTE — Consult Note (Signed)
Brief Consult Note: Diagnosis: elevated lfts.   Patient was seen by consultant.   Consult note dictated.   Recommend further assessment or treatment.   Comments: Patient seen and examined, full consult dictated [phone].  Patient admitted with progressive mental status changes in the settign of known denemtia.  Patient with renal failure and CHF, elevated lfts and abnormal abd us with biphasic portal flow, ascites present.  Possible cardiac ascites related to chf in the setting of tricuspid valve regurgitation.  Possible mild asterixis, will check ammonia this evening.  Patient also recently placed on cipro as o/p for uti, this can also cause elevation of lfts.  Will follow, daily lfts, monitor pt .  Electronic Signatures: Barnetta ChapelSkulskie, Kutter Schnepf (MD)  (Signed 21-Jul-13 20:47)  Authored: Brief Consult Note   Last Updated: 21-Jul-13 20:47 by Barnetta ChapelSkulskie, Ashby Moskal (MD)
